# Patient Record
Sex: Male | Born: 1985 | Race: White | Hispanic: No | Marital: Single | Smoking: Never smoker
Health system: Southern US, Community
[De-identification: ages and names within clinical notes are randomized; demographics above are authoritative.]

## PROBLEM LIST (undated history)

## (undated) HISTORY — PX: APPENDECTOMY: SHX54

---

## 2008-11-29 ENCOUNTER — Encounter (INDEPENDENT_AMBULATORY_CARE_PROVIDER_SITE_OTHER): Payer: Self-pay | Admitting: General Surgery

## 2008-11-29 ENCOUNTER — Inpatient Hospital Stay (HOSPITAL_COMMUNITY): Admission: EM | Admit: 2008-11-29 | Discharge: 2008-11-30 | Payer: Self-pay | Admitting: Emergency Medicine

## 2010-04-09 LAB — URINALYSIS, ROUTINE W REFLEX MICROSCOPIC
Glucose, UA: NEGATIVE mg/dL
Hgb urine dipstick: NEGATIVE
Protein, ur: NEGATIVE mg/dL
Specific Gravity, Urine: 1.02 (ref 1.005–1.030)
pH: 7.5 (ref 5.0–8.0)

## 2010-04-09 LAB — COMPREHENSIVE METABOLIC PANEL
ALT: 234 U/L — ABNORMAL HIGH (ref 0–53)
Albumin: 4.2 g/dL (ref 3.5–5.2)
Alkaline Phosphatase: 109 U/L (ref 39–117)
BUN: 5 mg/dL — ABNORMAL LOW (ref 6–23)
Calcium: 9.4 mg/dL (ref 8.4–10.5)
Potassium: 3.6 mEq/L (ref 3.5–5.1)
Sodium: 138 mEq/L (ref 135–145)
Total Protein: 7.3 g/dL (ref 6.0–8.3)

## 2010-04-09 LAB — DIFFERENTIAL
Basophils Relative: 0 % (ref 0–1)
Lymphs Abs: 2.3 10*3/uL (ref 0.7–4.0)
Monocytes Absolute: 0.8 10*3/uL (ref 0.1–1.0)
Monocytes Relative: 8 % (ref 3–12)
Neutro Abs: 7.5 10*3/uL (ref 1.7–7.7)
Neutrophils Relative %: 70 % (ref 43–77)

## 2010-04-09 LAB — CBC
MCHC: 34.9 g/dL (ref 30.0–36.0)
Platelets: 229 10*3/uL (ref 150–400)
RDW: 13.1 % (ref 11.5–15.5)

## 2010-09-11 DIAGNOSIS — S51809A Unspecified open wound of unspecified forearm, initial encounter: Secondary | ICD-10-CM | POA: Insufficient documentation

## 2010-09-11 DIAGNOSIS — X58XXXA Exposure to other specified factors, initial encounter: Secondary | ICD-10-CM | POA: Insufficient documentation

## 2010-09-12 ENCOUNTER — Encounter: Payer: Self-pay | Admitting: *Deleted

## 2010-09-12 ENCOUNTER — Emergency Department (HOSPITAL_COMMUNITY)
Admission: EM | Admit: 2010-09-12 | Discharge: 2010-09-12 | Disposition: A | Discharge status: Home / Self Care | Payer: Medicaid Other | Attending: Emergency Medicine | Admitting: Emergency Medicine

## 2010-09-12 DIAGNOSIS — S51811A Laceration without foreign body of right forearm, initial encounter: Secondary | ICD-10-CM

## 2010-09-12 MED ORDER — TETANUS-DIPHTH-ACELL PERTUSSIS 5-2.5-18.5 LF-MCG/0.5 IM SUSP
0.5000 mL | Freq: Once | INTRAMUSCULAR | Status: AC
  Administered 2010-09-12: 0.5 mL via INTRAMUSCULAR
  Filled 2010-09-12: qty 0.5

## 2010-09-12 MED ORDER — DOXYCYCLINE HYCLATE 100 MG PO CAPS: 100.0000 mg | ORAL_CAPSULE | Freq: Two times a day (BID) | ORAL | Status: AC

## 2010-09-12 MED ORDER — DOXYCYCLINE HYCLATE 100 MG PO TABS
100.0000 mg | ORAL_TABLET | Freq: Once | ORAL | Status: AC
  Administered 2010-09-12: 100 mg via ORAL
  Filled 2010-09-12: qty 1

## 2010-09-12 NOTE — ED Notes (Signed)
Superficial lac to rt forearm, injury , pinched skin in ladder.  Hurts with movement. NAD

## 2010-09-12 NOTE — ED Notes (Signed)
Rt arm hurts , injury on ladder

## 2010-09-12 NOTE — ED Provider Notes (Signed)
History     CSN: [id number] Arrival date & time: 09/12/2010 12:17 AM  Chief Complaint  Patient presents with  . Arm Pain   Patient is a 25 y.o. male presenting with arm pain. The history is provided by the patient and the spouse.  Arm Pain This is a new problem. The current episode started yesterday. The problem has been gradually worsening. Pertinent negatives include no chest pain, no abdominal pain, no headaches and no shortness of breath. The symptoms are aggravated by nothing. The symptoms are relieved by nothing.   SCRAPPED POSTERIOR RIGHT PROXIMAL FOREARM ON LADDER LOCK YESTERDAY, NOW WITH INCREASED REDNESS AND SORENESS. TD NOT UTD. NO FALL OR SIG IMPACT TO RESULT IN FRACTURE. History reviewed. No pertinent past medical history.  Past Surgical History  Procedure Date  . Appendectomy     History reviewed. No pertinent family history.  History  Substance Use Topics  . Smoking status: Never Smoker   . Smokeless tobacco: Not on file  . Alcohol Use: No      Review of Systems  Constitutional: Negative for activity change.  HENT: Negative for neck pain.   Respiratory: Negative for shortness of breath.   Cardiovascular: Negative for chest pain.  Gastrointestinal: Negative for abdominal pain.  Genitourinary: Negative for dysuria.  Musculoskeletal: Negative for back pain.  Neurological: Negative for headaches.  Hematological: Does not bruise/bleed easily.    Physical Exam  BP 135/85  Pulse 85  Temp(Src) 98 F (36.7 C) (Oral)  Resp 20  Wt 196 lb (88.905 kg)  SpO2 99%  Physical Exam  Nursing note and vitals reviewed. Constitutional: He is oriented to person, place, and time. He appears well-developed and well-nourished.  HENT:  Head: Normocephalic and atraumatic.  Eyes: EOM are normal. Pupils are equal, round, and reactive to light.  Neck: Normal range of motion. Neck supple.  Cardiovascular: Normal rate, regular rhythm and normal heart sounds.   Pulmonary/Chest:  Effort normal and breath sounds normal.  Abdominal: Soft. Bowel sounds are normal.  Musculoskeletal: Normal range of motion. He exhibits tenderness.       RIGHT POST PROXIMAL FOREARM WITH 3CM SUPERFICIAL LACERATION OR SCRAP, WITH SURROUNDING REDNESS, NO ACTIVE BLEEDING.   Neurological: He is alert and oriented to person, place, and time. No cranial nerve deficit.  Skin: Skin is warm and dry. No rash noted. There is erythema.    ED Course  Procedures  MDM NO SUTURE REPAIR REQUIRED BUT MAY BE DEVELOPING EARLY INFECTION. NEED TDAP.       Shelda Jakes, MD 09/12/10 [phone]

## 2011-07-06 ENCOUNTER — Emergency Department (HOSPITAL_COMMUNITY)
Admission: EM | Admit: 2011-07-06 | Discharge: 2011-07-06 | Disposition: A | Discharge status: Home / Self Care | Payer: Medicaid Other | Attending: Emergency Medicine | Admitting: Emergency Medicine

## 2011-07-06 ENCOUNTER — Encounter (HOSPITAL_COMMUNITY): Payer: Self-pay | Admitting: *Deleted

## 2011-07-06 DIAGNOSIS — IMO0002 Reserved for concepts with insufficient information to code with codable children: Secondary | ICD-10-CM | POA: Insufficient documentation

## 2011-07-06 DIAGNOSIS — Z9089 Acquired absence of other organs: Secondary | ICD-10-CM | POA: Insufficient documentation

## 2011-07-06 DIAGNOSIS — M7918 Myalgia, other site: Secondary | ICD-10-CM

## 2011-07-06 DIAGNOSIS — S0003XA Contusion of scalp, initial encounter: Secondary | ICD-10-CM | POA: Insufficient documentation

## 2011-07-06 DIAGNOSIS — S0083XA Contusion of other part of head, initial encounter: Secondary | ICD-10-CM

## 2011-07-06 DIAGNOSIS — S1093XA Contusion of unspecified part of neck, initial encounter: Secondary | ICD-10-CM | POA: Insufficient documentation

## 2011-07-06 MED ORDER — CYCLOBENZAPRINE HCL 10 MG PO TABS: 10.0000 mg | ORAL_TABLET | Freq: Three times a day (TID) | ORAL | Status: AC | PRN

## 2011-07-06 MED ORDER — FLUORESCEIN SODIUM 1 MG OP STRP
1.0000 | ORAL_STRIP | Freq: Once | OPHTHALMIC | Status: AC
  Administered 2011-07-06: 1 via OPHTHALMIC

## 2011-07-06 NOTE — ED Notes (Signed)
Visual acuity completed without any corrective devices
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
## 2011-07-06 NOTE — Discharge Instructions (Signed)
Ice packs to the injured or sore muscles for the next several days then start using heat. Take the medications for pain and muscle spasms. Continue your naproxen (if run out you can take ibuprofen 600 mg 4 times a day OR Aleve OTC 2 tabs twice a day) You should have your eye checked by an ophthalmologist (eye specialist), Dr Lita Mains is in [place], please call his office to get an appointment. If you notice you have problems with your nose, you can be seen by Dr Suszanne Conners, and ENT (Ears, Nose and Throat specialist). Call his office to get an appointment. Use triple antibiotic ointment on the bruising/abrasion to the tip of your nose.  Return to the ED for any problems listed on the head injury sheet. Recheck if you aren't improving in the next week.

## 2012-05-22 ENCOUNTER — Emergency Department (HOSPITAL_COMMUNITY)
Admission: EM | Admit: 2012-05-22 | Discharge: 2012-05-22 | Disposition: A | Discharge status: Home / Self Care | Payer: Medicaid Other | Attending: Emergency Medicine | Admitting: Emergency Medicine

## 2012-05-22 ENCOUNTER — Encounter (HOSPITAL_COMMUNITY): Payer: Self-pay | Admitting: *Deleted

## 2012-05-22 ENCOUNTER — Emergency Department (HOSPITAL_COMMUNITY): Payer: Medicaid Other

## 2012-05-22 DIAGNOSIS — J3489 Other specified disorders of nose and nasal sinuses: Secondary | ICD-10-CM | POA: Insufficient documentation

## 2012-05-22 DIAGNOSIS — R51 Headache: Secondary | ICD-10-CM | POA: Insufficient documentation

## 2012-05-22 DIAGNOSIS — J209 Acute bronchitis, unspecified: Secondary | ICD-10-CM

## 2012-05-22 DIAGNOSIS — R0602 Shortness of breath: Secondary | ICD-10-CM | POA: Insufficient documentation

## 2012-05-22 DIAGNOSIS — R0789 Other chest pain: Secondary | ICD-10-CM | POA: Insufficient documentation

## 2012-05-22 DIAGNOSIS — J029 Acute pharyngitis, unspecified: Secondary | ICD-10-CM | POA: Insufficient documentation

## 2012-05-22 DIAGNOSIS — J40 Bronchitis, not specified as acute or chronic: Secondary | ICD-10-CM | POA: Insufficient documentation

## 2012-05-22 MED ORDER — BENZONATATE 100 MG PO CAPS: 100.0000 mg | ORAL_CAPSULE | Freq: Three times a day (TID) | ORAL | Status: DC

## 2012-05-22 MED ORDER — PREDNISONE 10 MG PO TABS: ORAL_TABLET | ORAL | Status: DC

## 2012-05-22 MED ORDER — FEXOFENADINE-PSEUDOEPHED ER 60-120 MG PO TB12: 1.0000 | ORAL_TABLET | Freq: Two times a day (BID) | ORAL | Status: DC

## 2012-05-22 MED ORDER — ALBUTEROL SULFATE HFA 108 (90 BASE) MCG/ACT IN AERS
2.0000 | INHALATION_SPRAY | Freq: Once | RESPIRATORY_TRACT | Status: AC
  Administered 2012-05-22: 2 via RESPIRATORY_TRACT
  Filled 2012-05-22: qty 6.7

## 2012-05-22 NOTE — ED Provider Notes (Signed)
Medical screening examination/treatment/procedure(s) were performed by non-physician practitioner and as supervising physician I was immediately available for consultation/collaboration.   Joya Gaskins, MD 05/22/12 [phone]

## 2012-05-22 NOTE — ED Notes (Addendum)
Pt c/o cough, nasal congestion for a week, worse over the past few days, denies any fever. Cough is productive with green sputum, pt also reports chest pain that only occurs with coughing.

## 2012-05-22 NOTE — ED Notes (Signed)
Pt c/o cough and congestion since last night. Pt states he was mowing last night and symptoms began shortly after. Pt reports green mucus with cough.

## 2012-05-22 NOTE — ED Provider Notes (Signed)
History     CSN: [id number]  Arrival date & time 05/22/12  1226   First MD Initiated Contact with Patient 05/22/12 1247      Chief Complaint  Patient presents with  . Cough  . Nasal Congestion    (Consider location/radiation/quality/duration/timing/severity/associated sxs/prior treatment) HPI Comments: Justin Morales is a 27 y.o. Male presenting with a one week history of nasal congestion and cough, both which have been productive of yellow drainage/sputum.  He reports increased shortness of breath, denies wheezing,  But his chest feels tight when attempts to cough,  With this sensation radiating into his throat, coughing also causes considerable headache.  He does have a history of allergies, new to him this spring and has used zyrtec without relief.  His symptoms worsened after mowing the lawn yesterday.  He denies fevers or chills, has had no nausea, no earache, no swollen glands.  He has taken mucinex and robitussin without relief of symptoms.     The history is provided by the patient.    History reviewed. No pertinent past medical history.  Past Surgical History  Procedure Laterality Date  . Appendectomy      No family history on file.  History  Substance Use Topics  . Smoking status: Never Smoker   . Smokeless tobacco: Not on file  . Alcohol Use: No      Review of Systems  Constitutional: Negative for fever and chills.  HENT: Positive for congestion, sore throat and rhinorrhea. Negative for ear pain, neck pain and neck stiffness.   Eyes: Negative.   Respiratory: Positive for cough, chest tightness and shortness of breath. Negative for wheezing and stridor.   Cardiovascular: Negative for chest pain.  Gastrointestinal: Negative for nausea and abdominal pain.  Genitourinary: Negative.   Musculoskeletal: Negative for joint swelling and arthralgias.  Skin: Negative.  Negative for rash and wound.  Neurological: Negative for dizziness, weakness, light-headedness,  numbness and headaches.  Psychiatric/Behavioral: Negative.     Allergies  Review of patient's allergies indicates no known allergies.  Home Medications   Current Outpatient Rx  Name  Route  Sig  Dispense  Refill  . acetaminophen (TYLENOL) 500 MG tablet   Oral   Take 1,000 mg by mouth every 6 (six) hours as needed for pain.         Marland Kitchen dextromethorphan-guaiFENesin (MUCINEX DM) 30-600 MG per 12 hr tablet   Oral   Take 1 tablet by mouth every 12 (twelve) hours.         Marland Kitchen guaiFENesin (ROBITUSSIN) 100 MG/5ML SOLN   Oral   Take 15 mLs by mouth every 4 (four) hours as needed (cough).         . benzonatate (TESSALON) 100 MG capsule   Oral   Take 1 capsule (100 mg total) by mouth every 8 (eight) hours.   21 capsule   0   . fexofenadine-pseudoephedrine (ALLEGRA-D) 60-120 MG per tablet   Oral   Take 1 tablet by mouth every 12 (twelve) hours.   20 tablet   0   . predniSONE (DELTASONE) 10 MG tablet      6, 5, 4, 3, 2 then 1 tablet by mouth daily for 6 days total.   21 tablet   0     BP 134/85  Pulse 72  Temp(Src) 97.1 F (36.2 C) (Oral)  Resp 18  Ht 6\' 1"  (1.854 m)  Wt 195 lb (88.451 kg)  BMI 25.73 kg/m2  SpO2 97%  Physical Exam  Constitutional: He is oriented to person, place, and time. He appears well-developed and well-nourished.  HENT:  Head: Normocephalic and atraumatic.  Right Ear: Tympanic membrane and ear canal normal.  Left Ear: Tympanic membrane and ear canal normal.  Nose: Mucosal edema and rhinorrhea present.  Mouth/Throat: Uvula is midline and mucous membranes are normal. Posterior oropharyngeal erythema present. No oropharyngeal exudate, posterior oropharyngeal edema or tonsillar abscesses.  Mild soft palette erythema   Eyes: Conjunctivae are normal.  Cardiovascular: Normal rate and normal heart sounds.   Pulmonary/Chest: Effort normal. No respiratory distress. He has decreased breath sounds in the right lower field. He has no wheezes. He has no  rhonchi. He has no rales.  Abdominal: Soft. There is no tenderness.  Musculoskeletal: Normal range of motion.  Neurological: He is alert and oriented to person, place, and time.  Skin: Skin is warm and dry. No rash noted.  Psychiatric: He has a normal mood and affect.    ED Course  Procedures (including critical care time)  Labs Reviewed - No data to display Dg Chest 2 View  05/22/2012   *RADIOLOGY REPORT*  Clinical Data: Cough  CHEST - 2 VIEW  Comparison: No acute abdominal series 11/29/2008  Findings: Normal heart, mediastinal, and hilar contours.  Normal pulmonary vascularity.  The lungs are normally expanded and clear. Negative for pleural effusion.  The bones and upper abdomen appear within normal limits.  IMPRESSION: No acute cardiopulmonary disease.   Original Report Authenticated By: Britta Mccreedy, M.D.     1. Bronchitis with bronchospasm    Pt was given albuterol 2 puffs mdi with spacer.  His coughing resolved.  Recheck lung exam, clear,  Improved aeration, equal at both bases.  MDM  Patients labs and/or radiological studies were viewed and considered during the medical decision making and disposition process.  Pt was given mdi instructed in use.  Prescribed allegra d - advised to dc mucinex while taking allegra.  Tessalon prescribed for cough.  Prn f/u anticipated.  Referrals given. Pt has no pcp per pt, although Dr. Olena Leatherwood is reflected on chart, pt states this is incorrect.        Burgess Amor, PA-C 05/22/12 1524

## 2014-07-11 ENCOUNTER — Emergency Department (HOSPITAL_COMMUNITY): Payer: Self-pay

## 2014-07-11 ENCOUNTER — Encounter (HOSPITAL_COMMUNITY): Payer: Self-pay

## 2014-07-11 ENCOUNTER — Emergency Department (HOSPITAL_COMMUNITY)
Admission: EM | Admit: 2014-07-11 | Discharge: 2014-07-11 | Disposition: A | Discharge status: Home / Self Care | Payer: Self-pay | Attending: Emergency Medicine | Admitting: Emergency Medicine

## 2014-07-11 DIAGNOSIS — R109 Unspecified abdominal pain: Secondary | ICD-10-CM

## 2014-07-11 DIAGNOSIS — N201 Calculus of ureter: Secondary | ICD-10-CM | POA: Insufficient documentation

## 2014-07-11 DIAGNOSIS — N390 Urinary tract infection, site not specified: Secondary | ICD-10-CM | POA: Insufficient documentation

## 2014-07-11 LAB — CBC WITH DIFFERENTIAL/PLATELET
BASOS ABS: 0 10*3/uL (ref 0.0–0.1)
BASOS PCT: 0 % (ref 0–1)
Eosinophils Absolute: 0 10*3/uL (ref 0.0–0.7)
Eosinophils Relative: 0 % (ref 0–5)
HCT: 43.4 % (ref 39.0–52.0)
Hemoglobin: 14.8 g/dL (ref 13.0–17.0)
LYMPHS PCT: 6 % — AB (ref 12–46)
Lymphs Abs: 0.7 10*3/uL (ref 0.7–4.0)
MCH: 31.2 pg (ref 26.0–34.0)
MCHC: 34.1 g/dL (ref 30.0–36.0)
MCV: 91.4 fL (ref 78.0–100.0)
MONO ABS: 0.6 10*3/uL (ref 0.1–1.0)
Monocytes Relative: 5 % (ref 3–12)
NEUTROS ABS: 10.2 10*3/uL — AB (ref 1.7–7.7)
NEUTROS PCT: 89 % — AB (ref 43–77)
PLATELETS: 190 10*3/uL (ref 150–400)
RBC: 4.75 MIL/uL (ref 4.22–5.81)
RDW: 12.7 % (ref 11.5–15.5)
WBC: 11.6 10*3/uL — AB (ref 4.0–10.5)

## 2014-07-11 LAB — COMPREHENSIVE METABOLIC PANEL
ALT: 28 U/L (ref 17–63)
ANION GAP: 7 (ref 5–15)
AST: 22 U/L (ref 15–41)
Albumin: 4.7 g/dL (ref 3.5–5.0)
Alkaline Phosphatase: 86 U/L (ref 38–126)
BILIRUBIN TOTAL: 0.9 mg/dL (ref 0.3–1.2)
BUN: 12 mg/dL (ref 6–20)
CALCIUM: 9.2 mg/dL (ref 8.9–10.3)
CO2: 25 mmol/L (ref 22–32)
CREATININE: 0.92 mg/dL (ref 0.61–1.24)
Chloride: 106 mmol/L (ref 101–111)
Glucose, Bld: 173 mg/dL — ABNORMAL HIGH (ref 65–99)
POTASSIUM: 5.3 mmol/L — AB (ref 3.5–5.1)
SODIUM: 138 mmol/L (ref 135–145)
Total Protein: 7.5 g/dL (ref 6.5–8.1)

## 2014-07-11 LAB — URINALYSIS, ROUTINE W REFLEX MICROSCOPIC
Bilirubin Urine: NEGATIVE
Glucose, UA: NEGATIVE mg/dL
Ketones, ur: NEGATIVE mg/dL
Leukocytes, UA: NEGATIVE
NITRITE: NEGATIVE
PROTEIN: 30 mg/dL — AB
SPECIFIC GRAVITY, URINE: 1.025 (ref 1.005–1.030)
UROBILINOGEN UA: 1 mg/dL (ref 0.0–1.0)
pH: 6 (ref 5.0–8.0)

## 2014-07-11 LAB — LIPASE, BLOOD: LIPASE: 18 U/L — AB (ref 22–51)

## 2014-07-11 LAB — URINE MICROSCOPIC-ADD ON

## 2014-07-11 LAB — POTASSIUM: Potassium: 3.9 mmol/L (ref 3.5–5.1)

## 2014-07-11 MED ORDER — SODIUM CHLORIDE 0.9 % IV BOLUS (SEPSIS)
1000.0000 mL | Freq: Once | INTRAVENOUS | Status: AC
  Administered 2014-07-11: 1000 mL via INTRAVENOUS

## 2014-07-11 MED ORDER — DEXTROSE 5 % IV SOLN
1.0000 g | Freq: Once | INTRAVENOUS | Status: AC
  Administered 2014-07-11: 1 g via INTRAVENOUS
  Filled 2014-07-11: qty 10

## 2014-07-11 MED ORDER — TAMSULOSIN HCL 0.4 MG PO CAPS: 0.4000 mg | ORAL_CAPSULE | Freq: Every day | ORAL | Status: AC

## 2014-07-11 MED ORDER — ONDANSETRON HCL 4 MG/2ML IJ SOLN
4.0000 mg | Freq: Once | INTRAMUSCULAR | Status: AC
  Administered 2014-07-11: 4 mg via INTRAVENOUS
  Filled 2014-07-11: qty 2

## 2014-07-11 MED ORDER — CEPHALEXIN 500 MG PO CAPS: 500.0000 mg | ORAL_CAPSULE | Freq: Four times a day (QID) | ORAL | Status: AC

## 2014-07-11 MED ORDER — OXYCODONE-ACETAMINOPHEN 5-325 MG PO TABS: 1.0000 | ORAL_TABLET | ORAL | Status: AC | PRN

## 2014-07-11 MED ORDER — IBUPROFEN 800 MG PO TABS: 800.0000 mg | ORAL_TABLET | Freq: Three times a day (TID) | ORAL | Status: AC

## 2014-07-11 MED ORDER — ONDANSETRON HCL 4 MG PO TABS: 4.0000 mg | ORAL_TABLET | Freq: Four times a day (QID) | ORAL | Status: AC

## 2014-07-11 NOTE — ED Notes (Signed)
Pt complain of pain in lower abdomen with n/v . No BM x two days

## 2014-07-11 NOTE — ED Notes (Signed)
Patient with no complaints at this time. Respirations even and unlabored. Skin warm/dry. Discharge instructions reviewed with patient at this time. Patient given opportunity to voice concerns/ask questions. IV removed per policy and band-aid applied to site. Patient discharged at this time and left Emergency Department with steady gait.  

## 2014-07-11 NOTE — ED Provider Notes (Signed)
CSN: [id number]     Arrival date & time 07/11/14  1227 History   First MD Initiated Contact with Patient 07/11/14 1241     Chief Complaint  Patient presents with  . Abdominal Pain     (Consider location/radiation/quality/duration/timing/severity/associated sxs/prior Treatment) HPI Comments: Patient states lower abdominal pain that onset about 45 minutes ago on the left side of his abdomen. He thought this was due to hunger. After eating he had 2 episodes of vomiting. The pain persists. He has not had this pain in the past. Last bowel movement was yesterday. No fever. No dysuria hematuria. Pain is constant, nothing makes it better or worse. It is not worse with palpation. No testicular pain. No back pain. No chest pain or shortness of breath. Patient states he was fine prior to noon today. Good by mouth intake and urine output. No sick contacts. History of appendectomy. No history of kidney stones.  The history is provided by the patient.    History reviewed. No pertinent past medical history. Past Surgical History  Procedure Laterality Date  . Appendectomy     No family history on file. History  Substance Use Topics  . Smoking status: Never Smoker   . Smokeless tobacco: Not on file  . Alcohol Use: No    Review of Systems  Constitutional: Positive for activity change. Negative for fever, appetite change and fatigue.  HENT: Negative for congestion and rhinorrhea.   Respiratory: Negative for cough, chest tightness and shortness of breath.   Cardiovascular: Negative for chest pain.  Gastrointestinal: Positive for nausea, vomiting and abdominal pain. Negative for diarrhea and constipation.  Genitourinary: Negative for dysuria, urgency, hematuria and testicular pain.  Musculoskeletal: Negative for myalgias and arthralgias.  Skin: Negative for rash.  Neurological: Negative for dizziness, weakness and headaches.  A complete 10 system review of systems was obtained and all systems are  negative except as noted in the HPI and PMH.      Allergies  Review of patient's allergies indicates no known allergies.  Home Medications   Prior to Admission medications   Medication Sig Start Date End Date Taking? Authorizing Provider  acetaminophen (TYLENOL) 500 MG tablet Take 1,000 mg by mouth every 6 (six) hours as needed for pain.   Yes Historical Provider, MD  cephALEXin (KEFLEX) 500 MG capsule Take 1 capsule (500 mg total) by mouth 4 (four) times daily. 07/11/14   Glynn Octave, MD  ibuprofen (ADVIL,MOTRIN) 800 MG tablet Take 1 tablet (800 mg total) by mouth 3 (three) times daily. 07/11/14   Glynn Octave, MD  ondansetron (ZOFRAN) 4 MG tablet Take 1 tablet (4 mg total) by mouth every 6 (six) hours. 07/11/14   Glynn Octave, MD  oxyCODONE-acetaminophen (PERCOCET/ROXICET) 5-325 MG per tablet Take 1 tablet by mouth every 4 (four) hours as needed for severe pain. 07/11/14   Glynn Octave, MD  tamsulosin (FLOMAX) 0.4 MG CAPS capsule Take 1 capsule (0.4 mg total) by mouth daily. 07/11/14   Glynn Octave, MD   BP 105/65 mmHg  Pulse 81  Temp(Src) 98.6 F (37 C) (Oral)  Resp 16  Ht  (1.854 m)  Wt 205 lb (92.987 kg)  BMI 27.05 kg/m2  SpO2 98% Physical Exam  Constitutional: He is oriented to person, place, and time. He appears well-developed and well-nourished. No distress.  HENT:  Head: Normocephalic and atraumatic.  Mouth/Throat: Oropharynx is clear and moist. No oropharyngeal exudate.  Eyes: Conjunctivae and EOM are normal. Pupils are equal, round, and reactive to  light.  Neck: Normal range of motion. Neck supple.  No meningismus.  Cardiovascular: Normal rate, regular rhythm, normal heart sounds and intact distal pulses.   No murmur heard. Pulmonary/Chest: Effort normal and breath sounds normal. No respiratory distress.  Abdominal: Soft. There is tenderness. There is no rebound and no guarding.  TTP L lower abdomen.  No guarding or rebound  Genitourinary:  No  testicular tenderness   Musculoskeletal: Normal range of motion. He exhibits no edema or tenderness.  No CVAT  Neurological: He is alert and oriented to person, place, and time. No cranial nerve deficit. He exhibits normal muscle tone. Coordination normal.  No ataxia on finger to nose bilaterally. No pronator drift. 5/5 strength throughout. CN 2-12 intact. Negative Romberg. Equal grip strength. Sensation intact. Gait is normal.   Skin: Skin is warm.  Psychiatric: He has a normal mood and affect. His behavior is normal.  Nursing note and vitals reviewed.   ED Course  Procedures (including critical care time) Labs Review Labs Reviewed  CBC WITH DIFFERENTIAL/PLATELET - Abnormal; Notable for the following:    WBC 11.6 (*)    Neutrophils Relative % 89 (*)    Neutro Abs 10.2 (*)    Lymphocytes Relative 6 (*)    All other components within normal limits  COMPREHENSIVE METABOLIC PANEL - Abnormal; Notable for the following:    Potassium 5.3 (*)    Glucose, Bld 173 (*)    All other components within normal limits  URINALYSIS, ROUTINE W REFLEX MICROSCOPIC (NOT AT [facility]) - Abnormal; Notable for the following:    APPearance CLOUDY (*)    Hgb urine dipstick LARGE (*)    Protein, ur 30 (*)    All other components within normal limits  LIPASE, BLOOD - Abnormal; Notable for the following:    Lipase 18 (*)    All other components within normal limits  URINE MICROSCOPIC-ADD ON - Abnormal; Notable for the following:    Bacteria, UA MANY (*)    All other components within normal limits  URINE CULTURE  POTASSIUM    Imaging Review Ct Renal Stone Study  07/11/2014   CLINICAL DATA:  29 year old male with left lower quadrant pain today and gross hematuria. Initial encounter.  EXAM: CT ABDOMEN AND PELVIS WITHOUT CONTRAST  TECHNIQUE: Multidetector CT imaging of the abdomen and pelvis was performed following the standard protocol without IV contrast.  COMPARISON:  CT Abdomen and Pelvis 11/29/2008.   FINDINGS: Minor lower lobe atelectasis, otherwise negative lung bases. No pericardial or pleural effusion.  No acute osseous abnormality identified.  No pelvic free fluid. Negative distal colon aside from retained stool and mild redundancy. Negative left colon, transverse colon (redundant), and right colon. Interval appendectomy. Loculated material in the terminal ileum which otherwise appears normal. No dilated small bowel. Decompressed stomach and duodenum.  Noncontrast liver, gallbladder, spleen, pancreas, and adrenal glands are within normal limits. No abdominal free fluid.  Punctate left nephrolithiasis. There is a proximal left ureteral calculus measuring 4 mm diameter located within 2 cm of the left UPJ (series 2, image 39 and series 3, image 50). Mild left hydronephrosis. Distal to the calculus the left ureter is normal to the bladder. The bladder is diminutive.  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 Nonobstructing right nephrolithiasis up to 4 mm. No right hydronephrosis, hydroureter, or right ureteral calculus.  IMPRESSION: 1. Mild acute obstructive uropathy on the left with a 4 mm proximal left ureteral calculus located within  2 cm of the UPJ. 2. Right nephrolithiasis. 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 3. Interval appendectomy.   Electronically Signed   By: Odessa FlemingH  Hall M.D.   On: 07/11/2014 15:12     EKG Interpretation None      MDM   Final diagnoses:  Flank pain  Ureteral stone  Urinary tract infection without hematuria, site unspecified   Acute onset Left-sided abdominal pain with nausea and vomiting.  UA shows large blood with many bacteria. White blood cell count 11.6.  Patient's pain has resolved without intervention. No further vomiting in the ED. Suspect passed kidney stone.  CT shows 4 mm proximal left ureteral stone. Patient with no further vomiting or pain in the ED. Her urine analysis shows large blood  with bacteria. Concern for infected stone. Patient is afebrile and no leukocytosis. Given IV Rocephin in the ED. Repeat K is normal on recheck.  Discussed with Dr. Sherron MondaymacDiarmid of urology. He agrees with treatment for stone as well as antibiotics. [facility] if he develops fever or worsening vomiting.  BP 105/65 mmHg  Pulse 81  Temp(Src) 98.6 F (37 C) (Oral)  Resp 16  Ht 6\' 1"  (1.854 m)  Wt 205 lb (92.987 kg)  BMI 27.05 kg/m2  SpO2 98%   Glynn OctaveStephen Shem Plemmons, MD 07/11/14 1659

## 2014-07-11 NOTE — Discharge Instructions (Signed)
Kidney Stones Follow-up with the urologist. If you develop a fever or vomiting return to the emergency department. Antibiotics and medications as prescribed. Return to the ED if you develop new or worsening symptoms. Kidney stones (urolithiasis) are deposits that form inside your kidneys. The intense pain is caused by the stone moving through the urinary tract. When the stone moves, the ureter goes into spasm around the stone. The stone is usually passed in the urine.  CAUSES   A disorder that makes certain neck glands produce too much parathyroid hormone (primary hyperparathyroidism).  A buildup of uric acid crystals, similar to gout in your joints.  Narrowing (stricture) of the ureter.  A kidney obstruction present at birth (congenital obstruction).  Previous surgery on the kidney or ureters.  Numerous kidney infections. SYMPTOMS   Feeling sick to your stomach (nauseous).  Throwing up (vomiting).  Blood in the urine (hematuria).  Pain that usually spreads (radiates) to the groin.  Frequency or urgency of urination. DIAGNOSIS   Taking a history and physical exam.  Blood or urine tests.  CT scan.  Occasionally, an examination of the inside of the urinary bladder (cystoscopy) is performed. TREATMENT   Observation.  Increasing your fluid intake.  Extracorporeal shock wave lithotripsy--This is a noninvasive procedure that uses shock waves to break up kidney stones.  Surgery may be needed if you have severe pain or persistent obstruction. There are various surgical procedures. Most of the procedures are performed with the use of small instruments. Only small incisions are needed to accommodate these instruments, so recovery time is minimized. The size, location, and chemical composition are all important variables that will determine the proper choice of action for you. Talk to your health care provider to better understand your situation so that you will minimize the risk of  injury to yourself and your kidney.  HOME CARE INSTRUCTIONS   Drink enough water and fluids to keep your urine clear or pale yellow. This will help you to pass the stone or stone fragments.  Strain all urine through the provided strainer. Keep all particulate matter and stones for your health care provider to see. The stone causing the pain may be as small as a grain of salt. It is very important to use the strainer each and every time you pass your urine. The collection of your stone will allow your health care provider to analyze it and verify that a stone has actually passed. The stone analysis will often identify what you can do to reduce the incidence of recurrences.  Only take over-the-counter or prescription medicines for pain, discomfort, or fever as directed by your health care provider.  Make a follow-up appointment with your health care provider as directed.  Get follow-up X-rays if required. The absence of pain does not always mean that the stone has passed. It may have only stopped moving. If the urine remains completely obstructed, it can cause loss of kidney function or even complete destruction of the kidney. It is your responsibility to make sure X-rays and follow-ups are completed. Ultrasounds of the kidney can show blockages and the status of the kidney. Ultrasounds are not associated with any radiation and can be performed easily in a matter of minutes. SEEK MEDICAL CARE IF:  You experience pain that is progressive and unresponsive to any pain medicine you have been prescribed. SEEK IMMEDIATE MEDICAL CARE IF:   Pain cannot be controlled with the prescribed medicine.  You have a fever or shaking chills.  The  severity or intensity of pain increases over 18 hours and is not relieved by pain medicine.  You develop a new onset of abdominal pain.  You feel faint or pass out.  You are unable to urinate. MAKE SURE YOU:   Understand these instructions.  Will watch your  condition.  Will get help right away if you are not doing well or get worse. Document Released: 12/22/2004 Document Revised: 08/24/2012 Document Reviewed: 05/25/2012 [facility] Patient Information 2015 [place], [place]. This information is not intended to replace advice given to you by your health care provider. Make sure you discuss any questions you have with your health care provider.

## 2014-07-13 LAB — URINE CULTURE: Culture: NO GROWTH

## 2016-10-30 IMAGING — CT CT RENAL STONE PROTOCOL
2 of 4 series · 16 of 46 positions shown, 18 images · non-contrast
Comparison: CT Abdomen and Pelvis 11/29/2008.

CLINICAL DATA: 28-year-old male with left lower quadrant pain today
and gross hematuria. Initial encounter.

EXAM:
CT ABDOMEN AND PELVIS WITHOUT CONTRAST
TECHNIQUE: Multidetector CT imaging of the abdomen and pelvis was performed
following the standard protocol without IV contrast.

[Series 2: standard/full over (age)lbs 5.0 · axial · 0.77mm/px · z∈[-444,+21]mm · 13 of 103 slices shown, 15 images]
[im 5/103  soft-tissue]
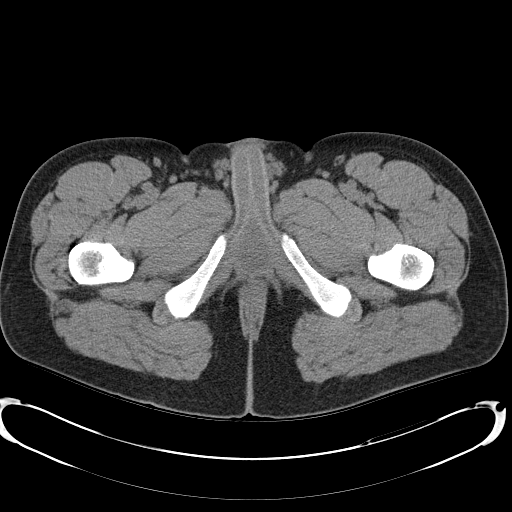
[im 5/103  bone]
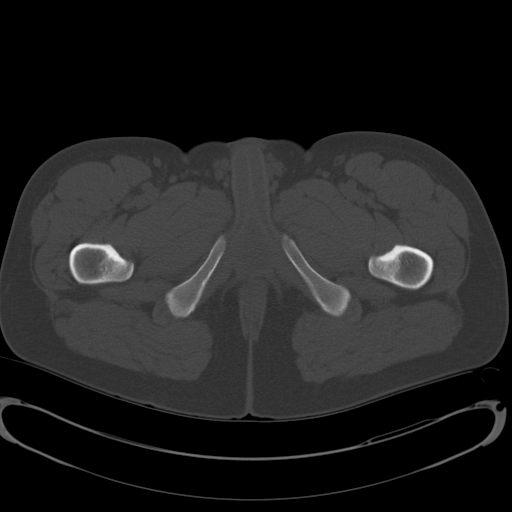
[im 13/103  soft-tissue]
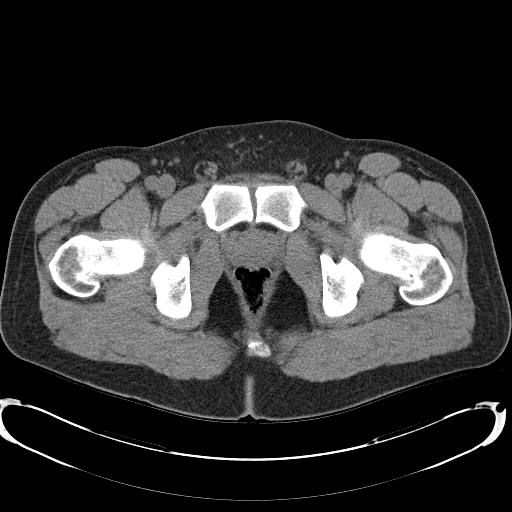
[im 22/103  soft-tissue]
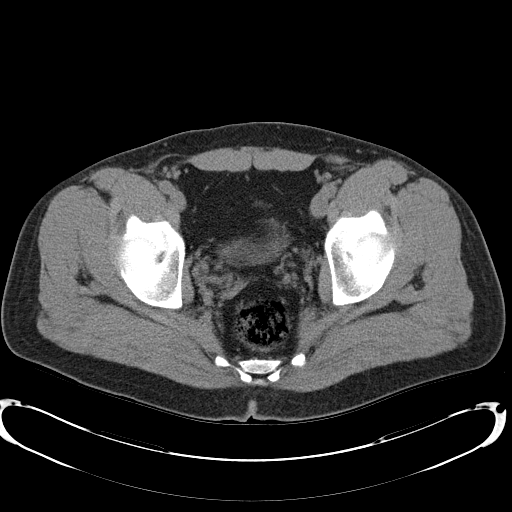
[im 30/103  soft-tissue]
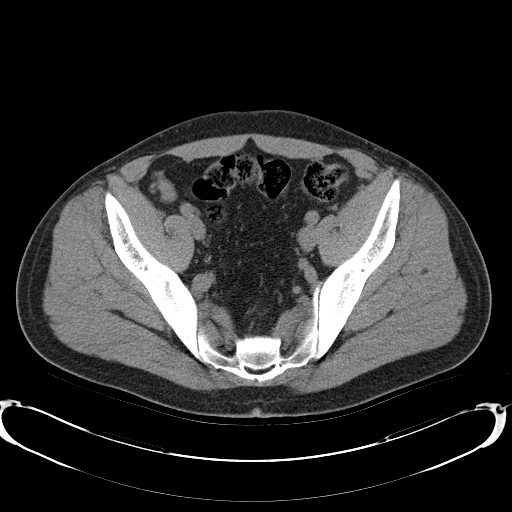
[im 35/103  soft-tissue]
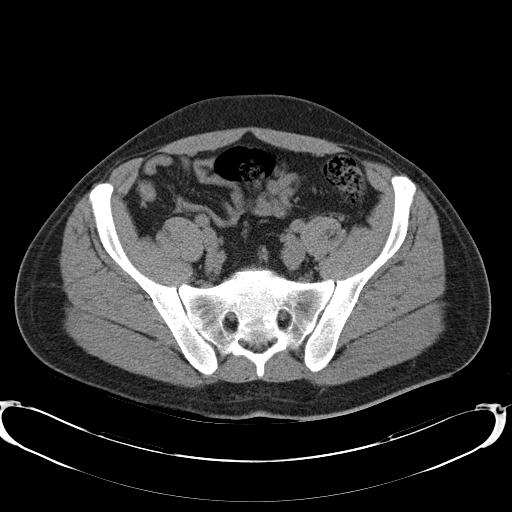
[im 43/103  soft-tissue]
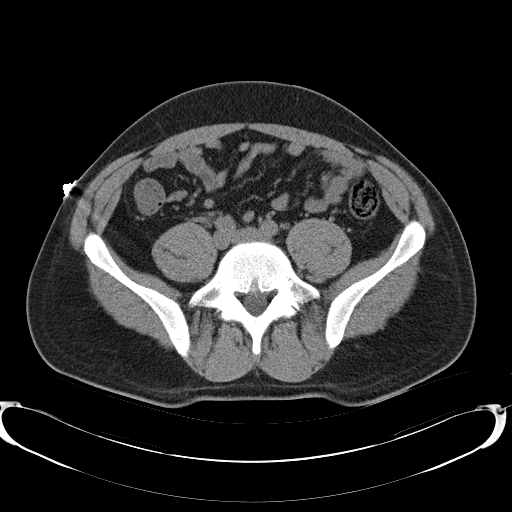
[im 52/103  soft-tissue]
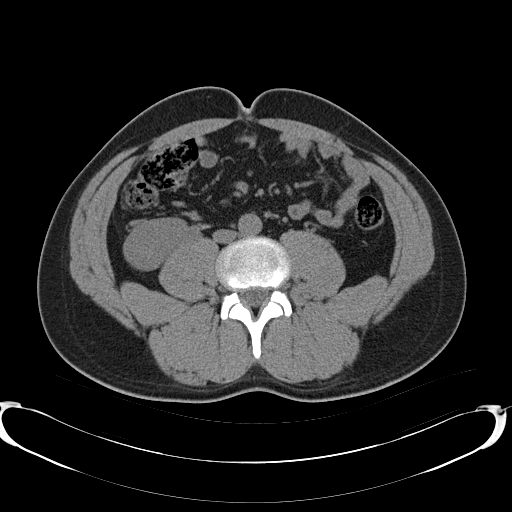
[im 60/103  soft-tissue]
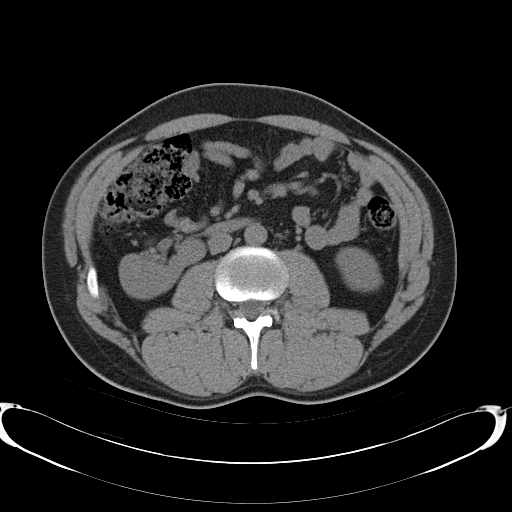
[im 69/103  soft-tissue]
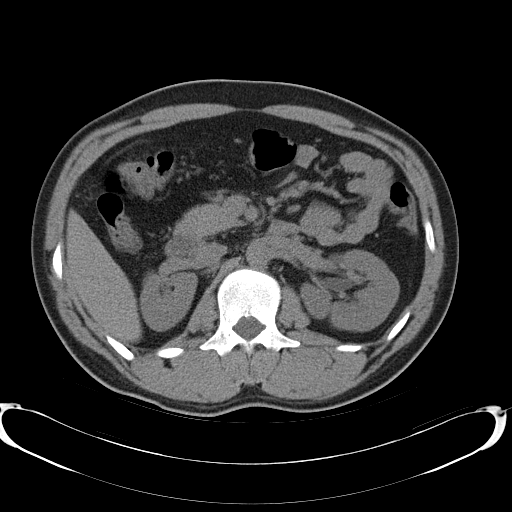
[im 69/103  bone]
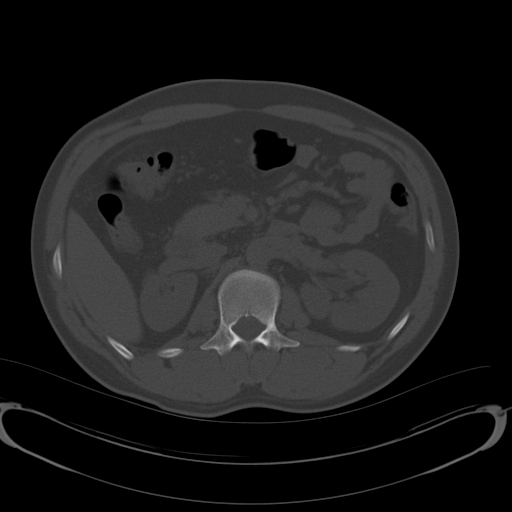
[im 73/103  soft-tissue]
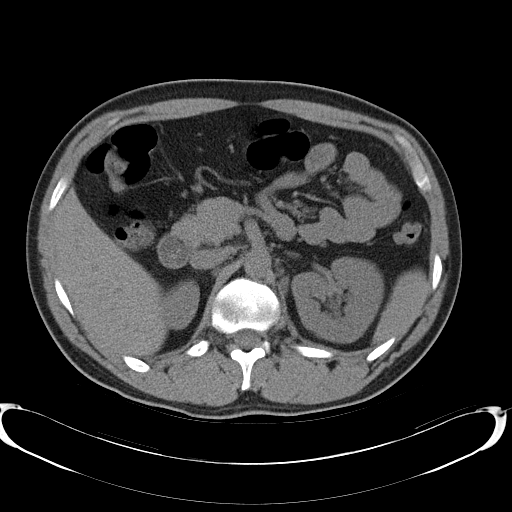
[im 81/103  soft-tissue]
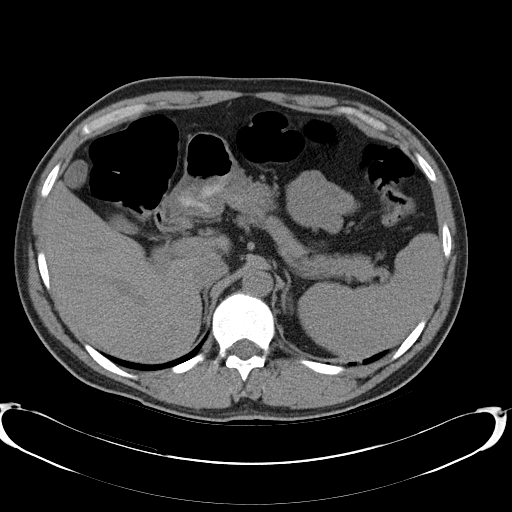
[im 90/103  soft-tissue]
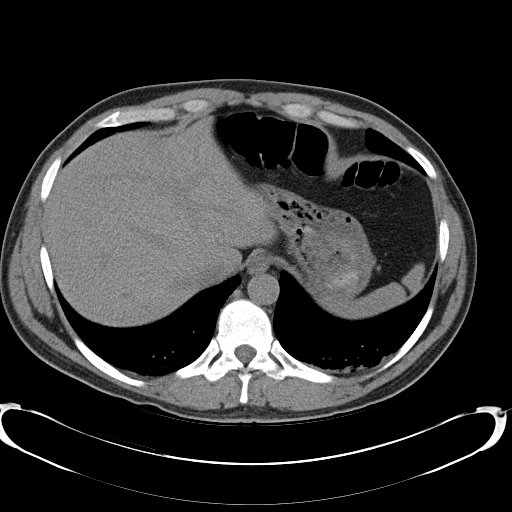
[im 98/103  soft-tissue]
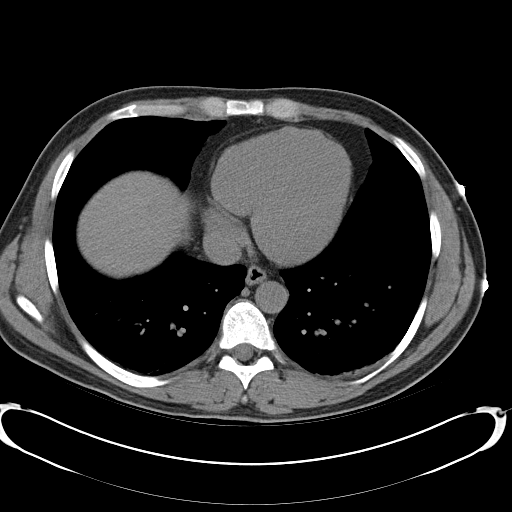

[Series 3: mpr coronal · coronal · 0.80mm/px · 3 of 89 slices shown]
[im 30/89  soft-tissue]
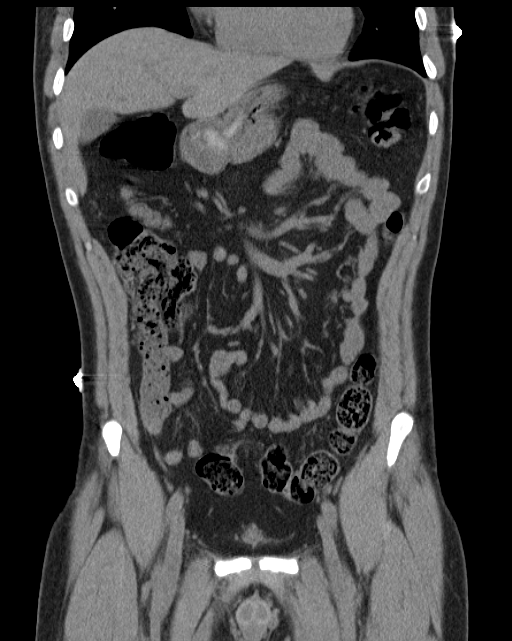
[im 40/89  soft-tissue]
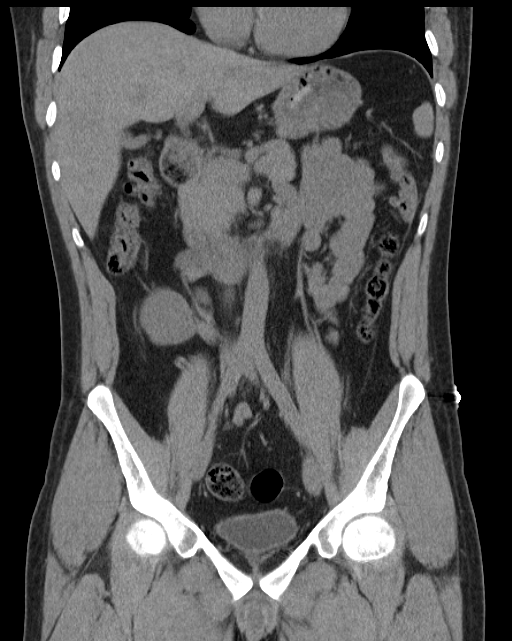
[im 49/89  soft-tissue]
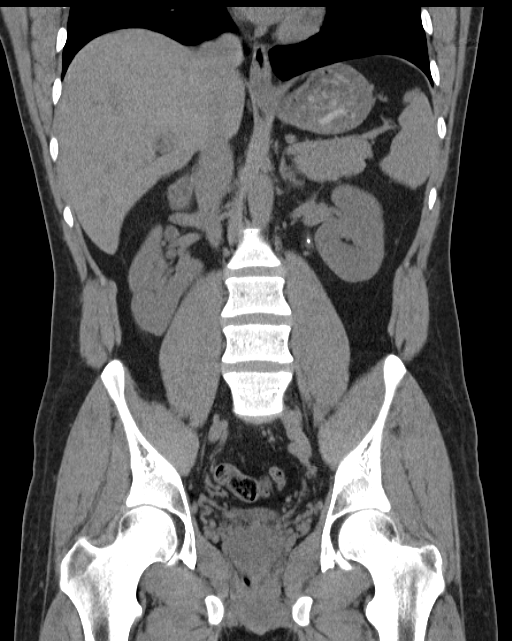

[16 of 46 positions shown; findings below may reference images not displayed]

FINDINGS: Minor lower lobe atelectasis, otherwise negative lung bases. No
pericardial or pleural effusion.

No acute osseous abnormality identified.

No pelvic free fluid. Negative distal colon aside from retained
stool and mild redundancy. Negative left colon, transverse colon
(redundant), and right colon. Interval appendectomy. Loculated
material in the terminal ileum which otherwise appears normal. No
dilated small bowel. Decompressed stomach and duodenum.

Noncontrast liver, gallbladder, spleen, pancreas, and adrenal glands
are within normal limits. No abdominal free fluid.

Punctate left nephrolithiasis. There is a proximal left ureteral
calculus measuring 4 mm diameter located within 2 cm of the left UPJ
(series 2, image 39 and series 3, image 50). Mild left
hydronephrosis. Distal to the calculus the left ureter is normal to
the bladder. The bladder is diminutive.

Congenitally lobulated and mildly malrotated appearance of the right
kidney again noted. Nonobstructing right nephrolithiasis up to 4 mm.
No right hydronephrosis, hydroureter, or right ureteral calculus.
IMPRESSION: 1. Mild acute obstructive uropathy on the left with a 4 mm proximal
left ureteral calculus located within 2 cm of the UPJ.
2. Right nephrolithiasis. Congenitally lobulated and mildly
malrotated right kidney.
3. Interval appendectomy.
